# Patient Record
Sex: Female | Born: 1960 | Race: Black or African American | Hispanic: No | Marital: Married | Smoking: Never smoker
Health system: Southern US, Community
[De-identification: ages and names within clinical notes are randomized; demographics above are authoritative.]

## PROBLEM LIST (undated history)

## (undated) DIAGNOSIS — E119 Type 2 diabetes mellitus without complications: Secondary | ICD-10-CM

## (undated) DIAGNOSIS — J45909 Unspecified asthma, uncomplicated: Secondary | ICD-10-CM

## (undated) DIAGNOSIS — E079 Disorder of thyroid, unspecified: Secondary | ICD-10-CM

## (undated) DIAGNOSIS — E0500 Thyrotoxicosis with diffuse goiter without thyrotoxic crisis or storm: Secondary | ICD-10-CM

## (undated) DIAGNOSIS — E559 Vitamin D deficiency, unspecified: Secondary | ICD-10-CM

## (undated) HISTORY — DX: Vitamin D deficiency, unspecified: E55.9

## (undated) HISTORY — PX: CHOLECYSTECTOMY: SHX55

## (undated) HISTORY — DX: Thyrotoxicosis with diffuse goiter without thyrotoxic crisis or storm: E05.00

## (undated) HISTORY — DX: Disorder of thyroid, unspecified: E07.9

---

## 1999-10-22 ENCOUNTER — Emergency Department (HOSPITAL_COMMUNITY): Admission: EM | Admit: 1999-10-22 | Discharge: 1999-10-22 | Payer: Self-pay | Admitting: Emergency Medicine

## 1999-10-22 ENCOUNTER — Encounter: Payer: Self-pay | Admitting: Emergency Medicine

## 1999-11-10 ENCOUNTER — Ambulatory Visit (HOSPITAL_COMMUNITY): Admission: RE | Admit: 1999-11-10 | Discharge: 1999-11-10 | Payer: Self-pay | Admitting: Family Medicine

## 1999-11-10 ENCOUNTER — Encounter: Payer: Self-pay | Admitting: Family Medicine

## 1999-12-11 ENCOUNTER — Ambulatory Visit (HOSPITAL_COMMUNITY): Admission: RE | Admit: 1999-12-11 | Discharge: 1999-12-11 | Payer: Self-pay | Admitting: Orthopedic Surgery

## 1999-12-11 ENCOUNTER — Encounter: Payer: Self-pay | Admitting: Orthopedic Surgery

## 2001-03-27 ENCOUNTER — Other Ambulatory Visit: Admission: RE | Admit: 2001-03-27 | Discharge: 2001-03-27 | Payer: Self-pay | Admitting: Obstetrics and Gynecology

## 2002-08-20 ENCOUNTER — Encounter: Payer: Self-pay | Admitting: Orthopedic Surgery

## 2002-08-20 ENCOUNTER — Encounter: Admission: RE | Admit: 2002-08-20 | Discharge: 2002-08-20 | Payer: Self-pay | Admitting: Orthopedic Surgery

## 2003-09-11 ENCOUNTER — Emergency Department (HOSPITAL_COMMUNITY): Admission: EM | Admit: 2003-09-11 | Discharge: 2003-09-11 | Payer: Self-pay | Admitting: Emergency Medicine

## 2004-07-18 ENCOUNTER — Ambulatory Visit (HOSPITAL_COMMUNITY): Admission: RE | Admit: 2004-07-18 | Discharge: 2004-07-18 | Payer: Self-pay | Admitting: Family Medicine

## 2005-05-19 ENCOUNTER — Emergency Department (HOSPITAL_COMMUNITY): Admission: EM | Admit: 2005-05-19 | Discharge: 2005-05-19 | Payer: Self-pay | Admitting: Emergency Medicine

## 2005-09-11 ENCOUNTER — Ambulatory Visit (HOSPITAL_COMMUNITY): Admission: RE | Admit: 2005-09-11 | Discharge: 2005-09-11 | Payer: Self-pay | Admitting: Obstetrics and Gynecology

## 2008-04-28 ENCOUNTER — Other Ambulatory Visit: Admission: RE | Admit: 2008-04-28 | Discharge: 2008-04-28 | Payer: Self-pay | Admitting: Obstetrics and Gynecology

## 2009-02-08 ENCOUNTER — Emergency Department (HOSPITAL_COMMUNITY): Admission: EM | Admit: 2009-02-08 | Discharge: 2009-02-08 | Payer: Self-pay | Admitting: Family Medicine

## 2009-08-23 ENCOUNTER — Other Ambulatory Visit: Admission: RE | Admit: 2009-08-23 | Discharge: 2009-08-23 | Payer: Self-pay | Admitting: Obstetrics and Gynecology

## 2011-10-19 ENCOUNTER — Other Ambulatory Visit (HOSPITAL_COMMUNITY)
Admission: RE | Admit: 2011-10-19 | Discharge: 2011-10-19 | Disposition: A | Discharge status: Home / Self Care | Payer: Self-pay | Source: Ambulatory Visit | Attending: Obstetrics and Gynecology | Admitting: Obstetrics and Gynecology

## 2011-10-19 DIAGNOSIS — Z01419 Encounter for gynecological examination (general) (routine) without abnormal findings: Secondary | ICD-10-CM | POA: Insufficient documentation

## 2012-08-04 ENCOUNTER — Encounter (HOSPITAL_COMMUNITY): Payer: Self-pay

## 2012-08-04 ENCOUNTER — Emergency Department (HOSPITAL_COMMUNITY)
Admission: EM | Admit: 2012-08-04 | Discharge: 2012-08-04 | Disposition: A | Discharge status: Home / Self Care | Payer: PRIVATE HEALTH INSURANCE | Source: Home / Self Care

## 2012-08-04 ENCOUNTER — Emergency Department (INDEPENDENT_AMBULATORY_CARE_PROVIDER_SITE_OTHER): Payer: PRIVATE HEALTH INSURANCE

## 2012-08-04 DIAGNOSIS — R071 Chest pain on breathing: Secondary | ICD-10-CM

## 2012-08-04 HISTORY — DX: Unspecified asthma, uncomplicated: J45.909

## 2012-08-04 HISTORY — DX: Type 2 diabetes mellitus without complications: E11.9

## 2012-08-04 MED ORDER — TRAMADOL HCL 50 MG PO TABS: 50.0000 mg | ORAL_TABLET | Freq: Four times a day (QID) | ORAL | Status: DC | PRN

## 2012-08-04 NOTE — ED Provider Notes (Signed)
Brenda Gill is a 52 y.o. female who presents to Urgent Care today for pain in her left scapula starting on Friday. Pain is sharp intense and worse with deep inspiration. She notes this causing some shortness of breath. She denies any chest pains palpitations or radiating pain. She denies fevers or chills. She denies cough or congestion. She says this is similar to previous episode of pneumonia.  She feels well otherwise. Pain has been worsening over the weekend.   No history of immobility injury or leg swelling. No cough.   PMH reviewed. Hypothyroidism History  Substance Use Topics  . Smoking status: Never Smoker   . Smokeless tobacco: Not on file  . Alcohol Use: No   ROS as above Medications reviewed. No current facility-administered medications for this encounter.   Current Outpatient Prescriptions  Medication Sig Dispense Refill  . levothyroxine (SYNTHROID, LEVOTHROID) 88 MCG tablet Take 88 mcg by mouth daily.      . traMADol (ULTRAM) 50 MG tablet Take 1 tablet (50 mg total) by mouth every 6 (six) hours as needed for pain.  30 tablet  0    Exam:  BP 170/96  Pulse 91  Temp(Src) 98.1 F (36.7 C) (Oral)  Resp 19  SpO2 100% Gen: Well NAD HEENT: EOMI,  MMM Lungs: CTABL Nl WOB Chest wall: Tender to palpation over the posterior left chest wall. Pain with scapular motion. Heart: RRR no MRG Abd: NABS, NT, ND Exts: Non edematous BL  LE, warm and well perfused.   No results found for this or any previous visit (from the past 24 hour(s)). Dg Chest 2 View  08/04/2012  *RADIOLOGY REPORT*  Clinical Data: Left scapular pain.  Pleuritic chest pain.  CHEST - 2 VIEW  Comparison: None.  Findings: Heart and mediastinal contours are within normal limits. No focal opacities or effusions.  No acute bony abnormality.  IMPRESSION: No active cardiopulmonary disease.   Original Report Authenticated By: Charlett Nose, M.D.     12 lead EKG: Normal sinus rhythm at 81 beats per minute. No  significant ST segment changes. Mild J-point elevation present.   Assessment and Plan: 52 y.o. female with likely costochondritis.  EKG, chest x-ray, vital signs with the exception of blood pressure are within normal limits.  Plan: Tramadol for pain (avoid NSAIDs secondary to elevated blood pressure).  Followup with primary care provider.  Discussed warning signs or symptoms. Please see discharge instructions. Patient expresses understanding.       Rodolph Bong, MD 08/04/12 [phone]

## 2012-08-04 NOTE — ED Provider Notes (Signed)
Medical screening examination/treatment/procedure(s) were performed by a resident physician and as supervising physician I was immediately available for consultation/collaboration.  Leslee Home, M.D.  Reuben Likes, MD 08/04/12 Rosamaria Lints

## 2012-08-04 NOTE — ED Notes (Signed)
C/o left sided chest pain with radiation into  scapular area for past couple of days; feels like it did with pneumonia

## 2013-10-09 ENCOUNTER — Encounter: Payer: Self-pay | Admitting: Adult Health

## 2013-10-09 ENCOUNTER — Other Ambulatory Visit (HOSPITAL_COMMUNITY)
Admission: RE | Admit: 2013-10-09 | Discharge: 2013-10-09 | Disposition: A | Discharge status: Home / Self Care | Payer: PRIVATE HEALTH INSURANCE | Source: Ambulatory Visit | Attending: Adult Health | Admitting: Adult Health

## 2013-10-09 ENCOUNTER — Ambulatory Visit (INDEPENDENT_AMBULATORY_CARE_PROVIDER_SITE_OTHER): Payer: PRIVATE HEALTH INSURANCE | Admitting: Adult Health

## 2013-10-09 VITALS — BP 142/86 | HR 76 | Ht 63.0 in | Wt 190.0 lb

## 2013-10-09 DIAGNOSIS — Z01419 Encounter for gynecological examination (general) (routine) without abnormal findings: Secondary | ICD-10-CM

## 2013-10-09 DIAGNOSIS — Z1151 Encounter for screening for human papillomavirus (HPV): Secondary | ICD-10-CM | POA: Insufficient documentation

## 2013-10-09 DIAGNOSIS — Z1212 Encounter for screening for malignant neoplasm of rectum: Secondary | ICD-10-CM

## 2013-10-09 LAB — HEMOCCULT GUIAC POC 1CARD (OFFICE): Fecal Occult Blood, POC: NEGATIVE

## 2013-10-09 NOTE — Progress Notes (Signed)
Patient ID: Brenda Gill, female   DOB: 08/03/1960, 53 y.o.   MRN: [id number] History of Present Illness: Brenda Gill is a 53 year old black female married in for a pap and physical.   Current Medications, Allergies, Past Medical History, Past Surgical History, Family History and Social History were reviewed in [facility] electronic medical record.     Review of Systems: Patient denies any headaches, blurred vision, shortness of breath, chest pain, abdominal pain, problems with bowel movements, urination, or intercourse. No joint pain or mood swings.Sees Dr Parke SimmersBland as PCP.Had colonoscopy at 51(5years per pt), has eye exam 11/22/13.    Physical Exam:BP 142/86  Pulse 76  Ht 5\' 3"  (1.6 m)  Wt 190 lb (86.183 kg)  BMI 33.67 kg/m2 General:  Well developed, well nourished, no acute distress Skin:  Warm and dry Neck:  Midline trachea, normal thyroid Lungs; Clear to auscultation bilaterally Breast:  No dominant palpable mass, retraction, or nipple discharge Cardiovascular: Regular rate and rhythm Abdomen:  Soft, non tender, no hepatosplenomegaly Pelvic:  External genitalia is normal in appearance.  The vagina is normal in appearance.  The cervix is bulbous,Pap with HPV performed.Marland Kitchen.  Uterus is felt to be normal size, shape, and contour.  No                adnexal masses or tenderness noted. Rectal: Good sphincter tone, no polyps, or hemorrhoids felt.  Hemoccult negative. Extremities:  No swelling or varicosities noted Psych:  No mood changes,alert and cooperatives,seems happy   Impression: Yearly gyn exam    Plan: Physical in 1 year,pap in 3 if negative HPV Mammogram yearly Labs with PCP Colonoscopy at 56

## 2013-10-09 NOTE — Patient Instructions (Signed)
Physical in 1 year Mammogram Colonoscopy 4755 Labs with PCP Eye exam 6/6

## 2014-02-22 ENCOUNTER — Emergency Department (HOSPITAL_COMMUNITY)
Admission: EM | Admit: 2014-02-22 | Discharge: 2014-02-23 | Disposition: A | Discharge status: Home / Self Care | Payer: PRIVATE HEALTH INSURANCE | Attending: Emergency Medicine | Admitting: Emergency Medicine

## 2014-02-22 ENCOUNTER — Encounter (HOSPITAL_COMMUNITY): Payer: Self-pay | Admitting: Emergency Medicine

## 2014-02-22 ENCOUNTER — Emergency Department (HOSPITAL_COMMUNITY): Payer: PRIVATE HEALTH INSURANCE

## 2014-02-22 DIAGNOSIS — J45909 Unspecified asthma, uncomplicated: Secondary | ICD-10-CM | POA: Diagnosis not present

## 2014-02-22 DIAGNOSIS — Y9289 Other specified places as the place of occurrence of the external cause: Secondary | ICD-10-CM | POA: Diagnosis not present

## 2014-02-22 DIAGNOSIS — E119 Type 2 diabetes mellitus without complications: Secondary | ICD-10-CM | POA: Diagnosis not present

## 2014-02-22 DIAGNOSIS — M25531 Pain in right wrist: Secondary | ICD-10-CM

## 2014-02-22 DIAGNOSIS — S59909A Unspecified injury of unspecified elbow, initial encounter: Secondary | ICD-10-CM | POA: Diagnosis not present

## 2014-02-22 DIAGNOSIS — E079 Disorder of thyroid, unspecified: Secondary | ICD-10-CM | POA: Insufficient documentation

## 2014-02-22 DIAGNOSIS — Y9389 Activity, other specified: Secondary | ICD-10-CM | POA: Insufficient documentation

## 2014-02-22 DIAGNOSIS — E559 Vitamin D deficiency, unspecified: Secondary | ICD-10-CM | POA: Insufficient documentation

## 2014-02-22 DIAGNOSIS — S6990XA Unspecified injury of unspecified wrist, hand and finger(s), initial encounter: Secondary | ICD-10-CM | POA: Insufficient documentation

## 2014-02-22 DIAGNOSIS — Z79899 Other long term (current) drug therapy: Secondary | ICD-10-CM | POA: Insufficient documentation

## 2014-02-22 DIAGNOSIS — M79641 Pain in right hand: Secondary | ICD-10-CM

## 2014-02-22 DIAGNOSIS — W010XXA Fall on same level from slipping, tripping and stumbling without subsequent striking against object, initial encounter: Secondary | ICD-10-CM | POA: Diagnosis not present

## 2014-02-22 DIAGNOSIS — Z91040 Latex allergy status: Secondary | ICD-10-CM | POA: Insufficient documentation

## 2014-02-22 DIAGNOSIS — S59919A Unspecified injury of unspecified forearm, initial encounter: Secondary | ICD-10-CM

## 2014-02-22 NOTE — ED Notes (Addendum)
Tripped and fell in the yard, uneven ground. C/i R 5th digit and ulnar hand pain. Minimal swelling noted. Skin intact. No meds PTA, ice applied. Rates pain 7/10. "feels better with ice. Pt elevating hand. Alert, NAD, calm, interactive.

## 2014-02-22 NOTE — ED Provider Notes (Signed)
CSN: [id number]     Arrival date & time 02/22/14  1907 History   First MD Initiated Contact with Patient 02/22/14 2148   This chart was scribed for non-physician practitioner Madelyn Flavors, PA-C, working with Vanetta Mulders, MD by Gwenevere Abbot, ED scribe. This patient was seen in room TR11C/TR11C and the patient's care was started at 9:59 PM.    Chief Complaint  Patient presents with  . Fall  . Hand Injury   The history is provided by the patient. No language interpreter was used.   HPI Comments:  Brenda Gill is a 53 y.o. female who presents to the Emergency Department complaining of a right arm and hand injury. Pt rates pain as 10 out of 10. Pt reports that she was trying to get a cat out of her yard on uneven ground. Pt reports that she fell and landed on her right arm. Pt reports that she has experienced swelling, tingling, and numbness. Pt reports that she is unable to make a fist. Pt denies taking medication PTA, but did apply ice. Pt reports that she has previously injured the right arm and saw an orthopedist. Pt reports that she has a latex allergy. Pt reports that she is otherwise healthy. All other ROS negative.   Past Medical History  Diagnosis Date  . Asthma   . Diabetes mellitus without complication   . Thyroid disease   . Graves disease   . Vitamin D deficiency disease    Past Surgical History  Procedure Laterality Date  . Cholecystectomy     Family History  Problem Relation Age of Onset  . Hypertension Mother   . Asthma Mother   . Hypertension Father   . Glaucoma Father   . Diabetes Father   . Ulcers Father   . GER disease Sister   . Hypertension Son   . Hypertension Maternal Grandmother   . Stroke Maternal Grandmother   . Heart attack Maternal Grandmother   . Diabetes Paternal Grandmother   . Heart disease Paternal Grandmother   . Seizures Paternal Grandmother    History  Substance Use Topics  . Smoking status: Never Smoker   . Smokeless tobacco:  Never Used  . Alcohol Use: No   OB History   Grav Para Term Preterm Abortions TAB SAB Ect Mult Living   Review of Systems  Musculoskeletal: Positive for arthralgias and myalgias.  All other systems reviewed and are negative.     Allergies  Diphenhydramine; Latex; Other; and Phenergan  Home Medications   Prior to Admission medications   Medication Sig Start Date End Date Taking? Authorizing Provider  albuterol (PROVENTIL HFA;VENTOLIN HFA) 108 (90 BASE) MCG/ACT inhaler Inhale 2 puffs into the lungs as needed for wheezing or shortness of breath.   Yes Historical Provider, MD  cholecalciferol (VITAMIN D) 1000 UNITS tablet Take 1,000 Units by mouth daily.   Yes Historical Provider, MD  levothyroxine (SYNTHROID, LEVOTHROID) 88 MCG tablet Take 112 mcg by mouth daily.    Yes Historical Provider, MD  mometasone-formoterol (DULERA) 100-5 MCG/ACT AERO Inhale 2 puffs into the lungs 2 (two) times daily.   Yes Historical Provider, MD  Multiple Vitamin (MULTIVITAMIN) tablet Take 1 tablet by mouth daily.   Yes Historical Provider, MD  saxagliptin HCl (ONGLYZA) 2.5 MG TABS tablet Take 2.5 mg by mouth daily.   Yes Historical Provider, MD   BP 136/80  Pulse 94  Temp(Src)  98.4 F (36.9 C) (Oral)  Resp 18  Ht  (1.626 m)  Wt 191 lb (86.637 kg)  BMI 32.77 kg/m2  SpO2 96% Physical Exam  Nursing note and vitals reviewed. Constitutional: She is oriented to person, place, and time. She appears well-developed and well-nourished.  HENT:  Head: Normocephalic and atraumatic.  Eyes: EOM are normal.  Neck: Normal range of motion. Neck supple.  Cardiovascular: Normal rate, regular rhythm, normal heart sounds and intact distal pulses.  Exam reveals no gallop and no friction rub.   No murmur heard. Pulses:      Radial pulses are 2+ on the right side, and 2+ on the left side.  Pulmonary/Chest: Effort normal and breath sounds normal. No respiratory distress. She has no wheezes. She  has no rales. She exhibits no tenderness.  Musculoskeletal:       Right wrist: She exhibits decreased range of motion, tenderness, bony tenderness and swelling. She exhibits no effusion, no crepitus, no deformity and no laceration.       Right hand: She exhibits decreased range of motion, tenderness and bony tenderness. She exhibits normal two-point discrimination, normal capillary refill, no deformity, no laceration and no swelling. Normal sensation noted. Normal strength noted.  Neurological: She is alert and oriented to person, place, and time.  Skin: Skin is warm and dry.  Psychiatric: She has a normal mood and affect. Her behavior is normal. Judgment and thought content normal.    ED Course  Procedures  DIAGNOSTIC STUDIES: Oxygen Saturation is 96% on RA, adequate by my interpretation.  COORDINATION OF CARE: 10:07 PM-Discussed treatment plan which includes x- ray of the right hand with pt at bedside and pt agreed to plan.  Labs Review Labs Reviewed - No data to display  Imaging Review Dg Wrist Complete Right  02/23/2014   CLINICAL DATA:  Fall, right wrist pain/tingling  EXAM: RIGHT WRIST - COMPLETE 3+ VIEW  COMPARISON:  None.  FINDINGS: No fracture or dislocation is seen.  The joint spaces are preserved.  The visualized soft tissues are unremarkable.  IMPRESSION: No fracture or dislocation is seen.   Electronically Signed   By: Charline Bills M.D.   On: 02/23/2014 00:39   Dg Hand Complete Right  02/22/2014   CLINICAL DATA:  Fall, right hand injury  EXAM: RIGHT HAND - COMPLETE 3+ VIEW  COMPARISON:  None.  FINDINGS: No fracture or dislocation is seen.  The joint spaces are preserved.  The visualized soft tissues are unremarkable.  IMPRESSION: No fracture or dislocation is seen.   Electronically Signed   By: Charline Bills M.D.   On: 02/22/2014 22:47     EKG Interpretation None      MDM   Final diagnoses:  Right wrist pain  Right hand pain   Patient is a 53 y.o. Female who  presents to the ED with right hand and wrist pain after a fall.  Physical exam reveals tenderness and some swelling.  Xrays reveal no acute fractures of the hand or wrist.  Will place in cockup velcro wrist splint.  Patient to follow-up with Dr. Mina Marble.  Patient will be given naproxen BID with food and hydrocodone for pain.  Patient states understanding and agreement to the above plan.  Patient told to return for compartment syndrome.  Patient states understanding and agreement.  Patient stable for discharge.    I personally performed the services described in this documentation, which was scribed in my presence. The recorded information has been reviewed  and is accurate.    Eben Burow, PA-C 02/23/14 [phone]

## 2014-02-23 ENCOUNTER — Emergency Department (HOSPITAL_COMMUNITY): Payer: PRIVATE HEALTH INSURANCE

## 2014-02-23 MED ORDER — HYDROCODONE-ACETAMINOPHEN 5-325 MG PO TABS: 1.0000 | ORAL_TABLET | Freq: Four times a day (QID) | ORAL | Status: DC | PRN

## 2014-02-23 MED ORDER — NAPROXEN 500 MG PO TABS
500.0000 mg | ORAL_TABLET | Freq: Two times a day (BID) | ORAL | Status: DC
Start: 2014-02-23 — End: 2016-10-15

## 2014-02-23 NOTE — Discharge Instructions (Signed)

## 2014-02-26 NOTE — ED Provider Notes (Signed)
Medical screening examination/treatment/procedure(s) were performed by non-physician practitioner and as supervising physician I was immediately available for consultation/collaboration.   EKG Interpretation None        Judene Logue, MD 02/26/14 0747 

## 2014-04-20 ENCOUNTER — Encounter (HOSPITAL_COMMUNITY): Payer: Self-pay | Admitting: Emergency Medicine

## 2015-02-16 IMAGING — CR DG WRIST COMPLETE 3+V*R*
4 series · 4 of 4 positions shown · non-contrast
Comparison: None.

CLINICAL DATA: Fall, right wrist pain/tingling

EXAM:
RIGHT WRIST - COMPLETE 3+ VIEW

[x wrist pa right *]
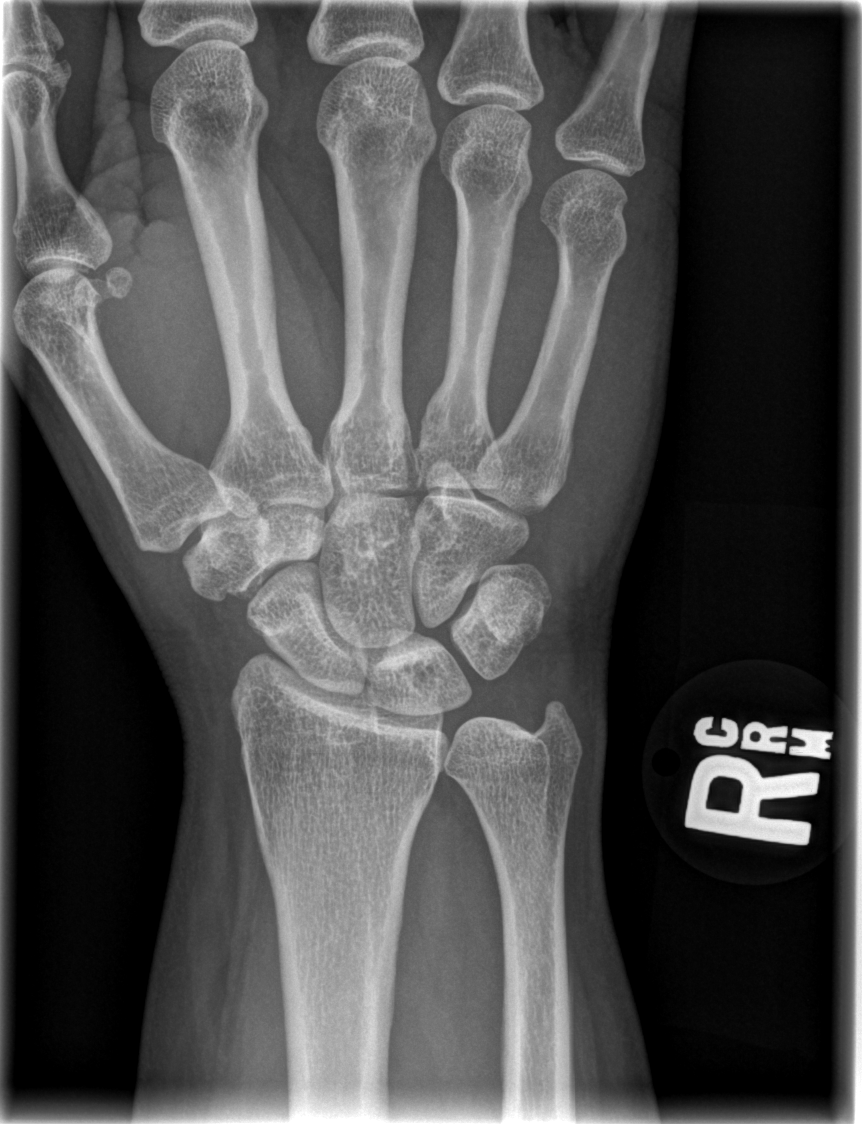

[x wrist obl right *]
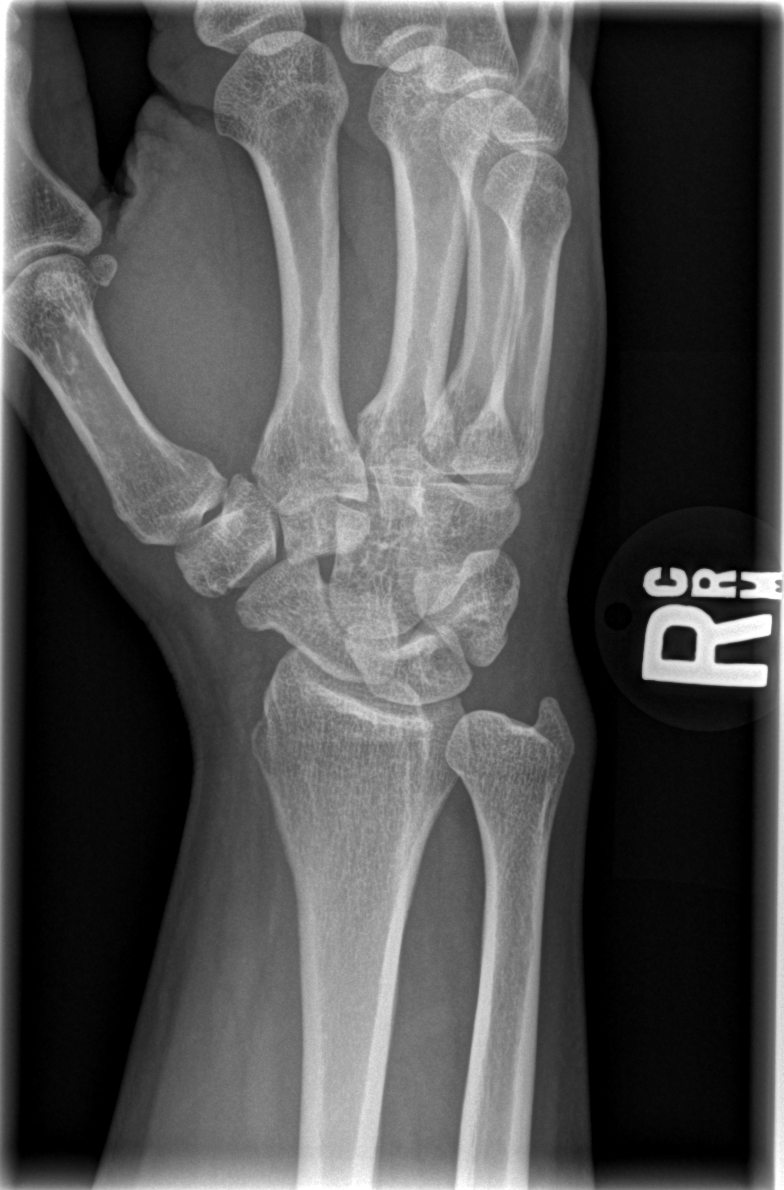

[x wrist lat right *]
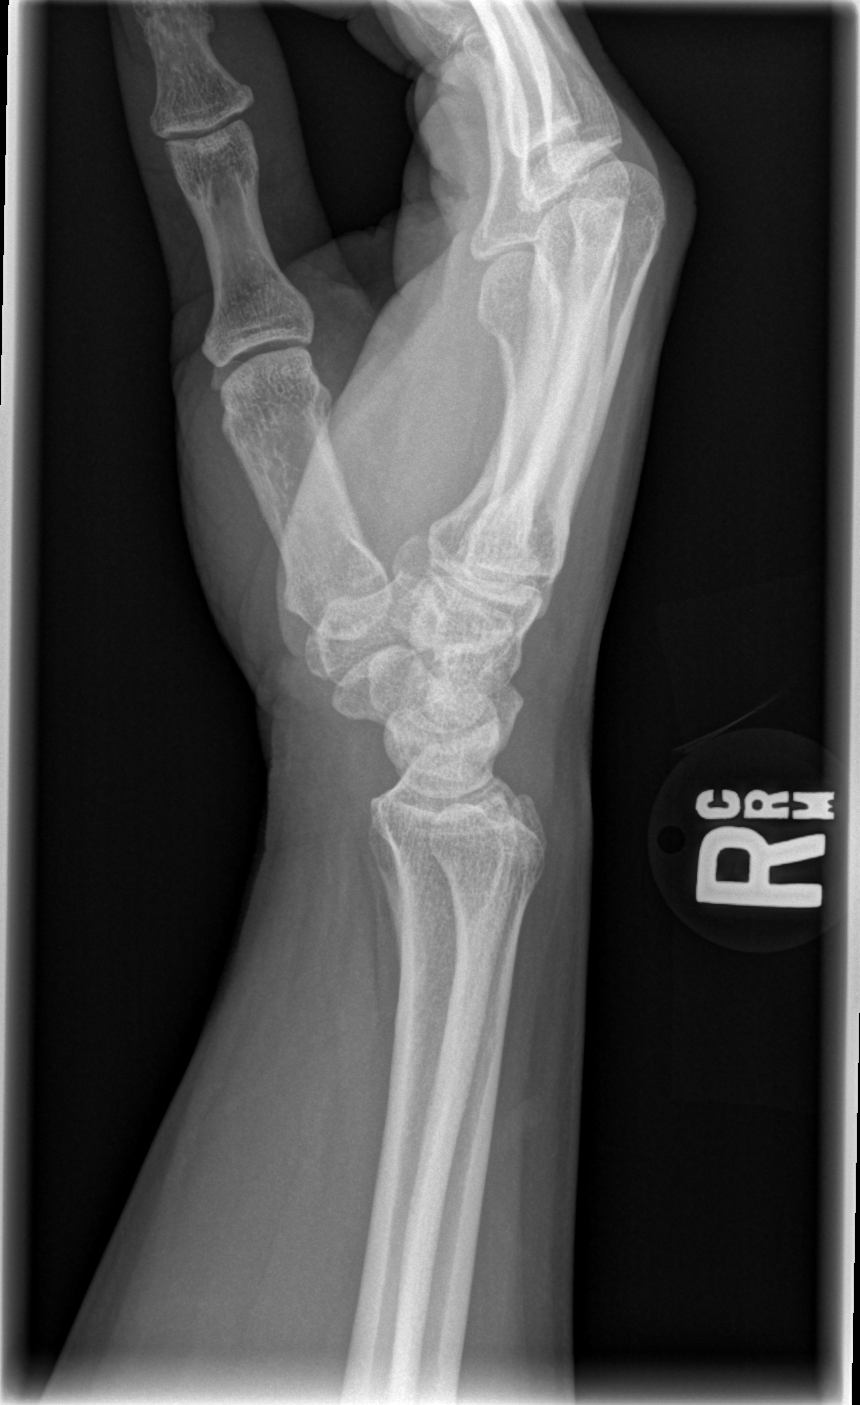

[x navicular]
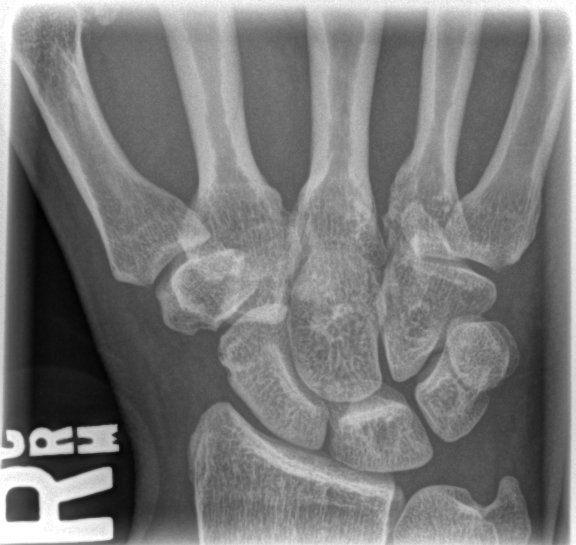

[4 of 4 positions shown; findings below may reference images not displayed]

FINDINGS: No fracture or dislocation is seen.

The joint spaces are preserved.

The visualized soft tissues are unremarkable.
IMPRESSION: No fracture or dislocation is seen.

## 2016-10-15 ENCOUNTER — Encounter (HOSPITAL_COMMUNITY): Payer: Self-pay | Admitting: Emergency Medicine

## 2016-10-15 ENCOUNTER — Telehealth (HOSPITAL_COMMUNITY): Payer: Self-pay | Admitting: Internal Medicine

## 2016-10-15 ENCOUNTER — Ambulatory Visit (HOSPITAL_COMMUNITY)
Admission: EM | Admit: 2016-10-15 | Discharge: 2016-10-15 | Disposition: A | Discharge status: Home / Self Care | Payer: PRIVATE HEALTH INSURANCE | Attending: Internal Medicine | Admitting: Internal Medicine

## 2016-10-15 DIAGNOSIS — M79661 Pain in right lower leg: Secondary | ICD-10-CM

## 2016-10-15 MED ORDER — NAPROXEN 500 MG PO TABS: 500.0000 mg | ORAL_TABLET | Freq: Two times a day (BID) | ORAL | 0 refills | Status: AC

## 2016-10-15 NOTE — ED Triage Notes (Signed)
The patient presented to the [facility] with a complaint right lower leg pain x 4 days. The patient reported that she had a leg cramp four days ago and the pain has been getting worse.

## 2016-10-15 NOTE — Discharge Instructions (Addendum)
Suspect calf pain is musculoskeletal, but will obtain ultrasound of the leg tomorrow to rule out a blood clot.  In the meantime, ice for 5-10 minutes several times daily should help with discomfort.  Prescription for naproxen was sent to the [facility] on [address], also to help with pain.  Gentle stretching should help also, but do not overdo.  If calf muscle is injured, stretching it very hard will make the pain worse.  Note for work tomorrow.  Recheck or followup with primary care provider this week if not improving.

## 2016-10-15 NOTE — Telephone Encounter (Signed)
See urgent care visit note 10/15/16.

## 2016-10-15 NOTE — ED Provider Notes (Signed)
[facility]    CSN: [id number] Arrival date & time: 10/15/16  1348     History   Chief Complaint Chief Complaint  Patient presents with  . Leg Pain    HPI Brenda Gill is a 56 y.o. female. She presents today with a 4 day history of right medial calf pain, particularly notable when she flexes her foot. 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 Onset seemed like a cramp during the night several nights ago. Calf feels a little tight to her, and she feels like her leg might be a little swollen. No bruise. No trauma.  Worried about DVT.    HPI  Past Medical History:  Diagnosis Date  . Asthma   . Diabetes mellitus without complication (HCC)   . Graves disease   . Thyroid disease   . Vitamin D deficiency disease     Past Surgical History:  Procedure Laterality Date  . CHOLECYSTECTOMY      OB History    Gravida Para Term Preterm AB Living   SAB TAB Ectopic Multiple Live Births           2       Home Medications    Prior to Admission medications   Medication Sig Start Date End Date Taking? Authorizing Provider  levothyroxine (SYNTHROID, LEVOTHROID) 88 MCG tablet Take 112 mcg by mouth daily.    Yes Historical Provider, MD  Linagliptin-Metformin HCl (JENTADUETO) 2.5-500 MG TABS Take by mouth.   Yes Historical Provider, MD  losartan (COZAAR) 25 MG tablet Take 25 mg by mouth daily.   Yes Historical Provider, MD  naproxen (NAPROSYN) 500 MG tablet Take 1 tablet (500 mg total) by mouth 2 (two) times daily. 10/15/16   Eustace Moore, MD    Family History Family History  Problem Relation Age of Onset  . Hypertension Mother   . Asthma Mother   . Hypertension Father   . Glaucoma Father   . Diabetes Father   . Ulcers Father   . GER disease Sister   . Hypertension Son   . Hypertension Maternal Grandmother   . Stroke Maternal Grandmother   . Heart attack  Maternal Grandmother   . Diabetes Paternal Grandmother   . Heart disease Paternal Grandmother   . Seizures Paternal Grandmother     Social History Social History  Substance Use Topics  . Smoking status: Never Smoker  . Smokeless tobacco: Never Used  . Alcohol use No     Allergies   Diphenhydramine; Latex; Other; and Phenergan [promethazine hcl]   Review of Systems Review of Systems  All other systems reviewed and are negative.    Physical Exam Triage Vital Signs ED Triage Vitals  Enc Vitals Group     BP 10/15/16 1502 127/80     Pulse Rate 10/15/16 1502 67     Resp 10/15/16 1502 14     Temp 10/15/16 1502 98.4 F (36.9 C)     Temp Source 10/15/16 1502 Oral     SpO2 10/15/16 1502 100 %     Weight --      Height --      Pain Score 10/15/16 1522 5     Pain Loc --    Updated Vital Signs BP 127/80 (BP Location: Right Arm)   Pulse 67  Temp 98.4 F (36.9 C) (Oral)   Resp 14   SpO2 100%   Physical Exam  Constitutional: She is oriented to person, place, and time. No distress.  HENT:  Head: Atraumatic.  Eyes:  Conjugate gaze observed, no eye redness/discharge  Neck: Neck supple.  Cardiovascular: Normal rate.   Pulmonary/Chest: No respiratory distress.  Abdominal: She exhibits no distension.  Musculoskeletal: Normal range of motion.  Right medial calf is tender proximally to touch. No bruising or erythema observed. She is able to fully dorsiflex her right foot, but it does hurt. Slight limp observed when walking. Right calf is 17.5 inches, left calf is 17-1/8 inches Right ankle is 10-3/16 inches, left ankle is 10-1/16 inches.  Neurological: She is alert and oriented to person, place, and time.  Skin: Skin is warm and dry.  Nursing note and vitals reviewed.    UC Treatments / Results   Procedures Procedures (including critical care time) None today  Final Clinical Impressions(s) / UC Diagnoses   Final diagnoses:  Right calf pain   Suspect calf pain  is musculoskeletal, but will obtain ultrasound of the leg tomorrow to rule out a blood clot.  In the meantime, ice for 5-10 minutes several times daily should help with discomfort.  Pres[facility] [address], also to help with pain.  Gentle stretching should help also, but do not overdo.  If calf muscle is injured, stretching it very hard will make the pain worse.  Note for work tomorrow.  Recheck or followup with primary care provider this week if not improving.    New Prescriptions New Prescriptions   NAPROXEN (NAPROSYN) 500 MG TABLET    Take 1 tablet (500 mg total) by mouth 2 (two) times daily.     Eustace Moore, MD 10/15/16 [phone]

## 2016-10-16 ENCOUNTER — Ambulatory Visit (HOSPITAL_COMMUNITY): Payer: PRIVATE HEALTH INSURANCE

## 2016-10-16 ENCOUNTER — Telehealth (HOSPITAL_COMMUNITY): Payer: Self-pay | Admitting: *Deleted
